# Patient Record
Sex: Male | Born: 1970 | Race: White | Hispanic: No | Smoking: Never smoker
Health system: Southern US, Community
[De-identification: ages and names within clinical notes are randomized; demographics above are authoritative.]

---

## 2015-09-25 ENCOUNTER — Other Ambulatory Visit: Payer: Self-pay | Admitting: Occupational Medicine

## 2015-09-25 ENCOUNTER — Ambulatory Visit: Payer: Self-pay

## 2015-09-25 DIAGNOSIS — M25532 Pain in left wrist: Secondary | ICD-10-CM

## 2016-07-01 ENCOUNTER — Other Ambulatory Visit: Payer: Self-pay | Admitting: Occupational Medicine

## 2016-07-01 ENCOUNTER — Ambulatory Visit: Payer: Self-pay

## 2016-07-01 DIAGNOSIS — M79642 Pain in left hand: Secondary | ICD-10-CM

## 2017-10-26 IMAGING — CR DG HAND COMPLETE 3+V*L*
3 series · 3 of 3 positions shown · non-contrast
Comparison: None.

CLINICAL DATA: Left hand pain at 3rd MCP joint, swelling, no known
trauma

EXAM:
LEFT HAND - COMPLETE 3+ VIEW

[view not recorded (1 of 3)]
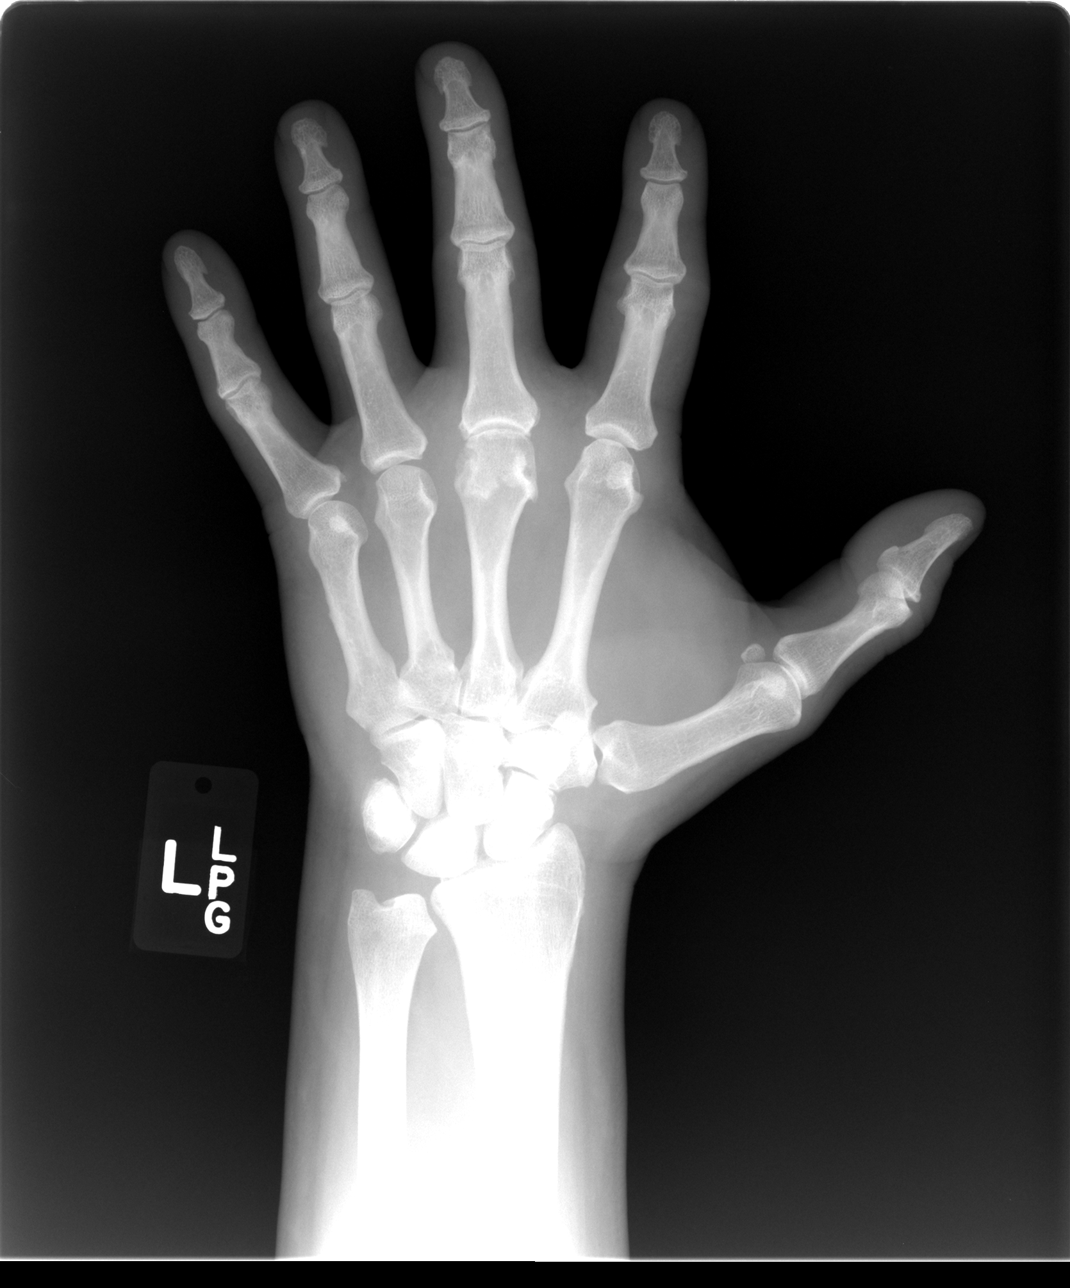

[view not recorded (2 of 3)]
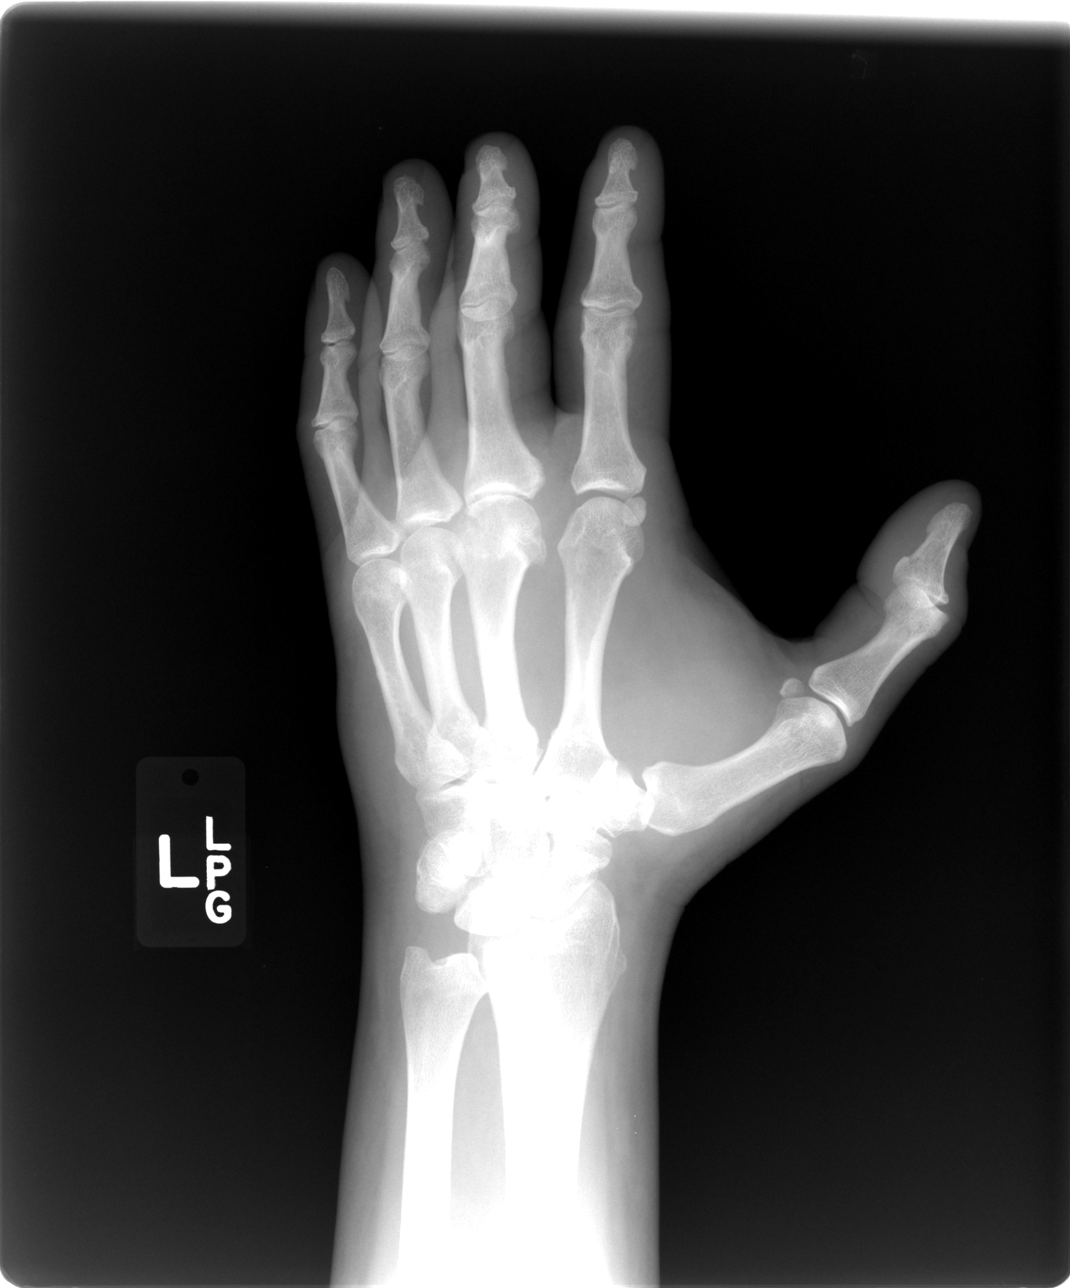

[view not recorded (3 of 3)]
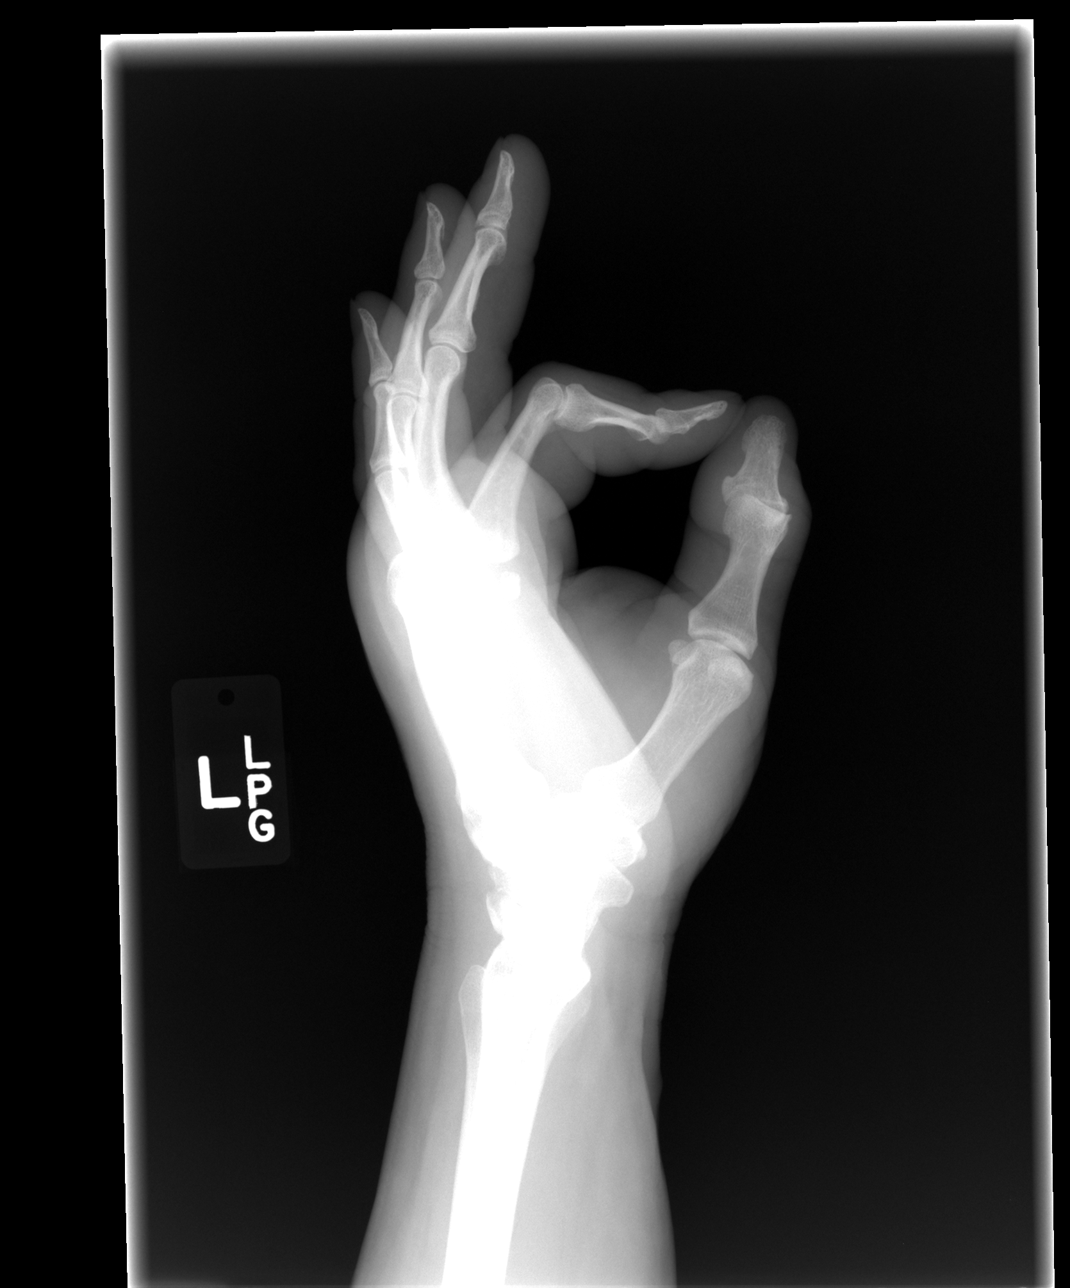

[3 of 3 positions shown; findings below may reference images not displayed]

FINDINGS: Mild to moderate joint space narrowing involving the 3rd MCP joint.
Associated marginal erosions. Hooked osteophytes involving the 2nd
and 3rd MTP joints. Overlying dorsal soft tissue swelling.

No associated chondrocalcinosis.

No fracture or dislocation is seen.

No radiopaque foreign body is seen.
IMPRESSION: Degenerative changes predominantly involving the 3rd MCP joint, as
described above. Primary differential considerations include CPPD
arthropathy or hemochromatosis.

## 2019-10-07 ENCOUNTER — Other Ambulatory Visit: Payer: Self-pay

## 2019-10-07 ENCOUNTER — Ambulatory Visit (HOSPITAL_COMMUNITY): Admission: EM | Admit: 2019-10-07 | Discharge: 2019-10-07 | Discharge status: Against Medical Advice | Payer: Self-pay

## 2019-10-07 NOTE — ED Notes (Signed)
T/C from ED - states pt registering there.

## 2019-10-11 ENCOUNTER — Other Ambulatory Visit: Payer: Self-pay

## 2019-10-16 ENCOUNTER — Ambulatory Visit: Payer: BC Managed Care – PPO | Attending: Internal Medicine

## 2019-10-16 DIAGNOSIS — Z20822 Contact with and (suspected) exposure to covid-19: Secondary | ICD-10-CM

## 2019-10-18 LAB — NOVEL CORONAVIRUS, NAA: SARS-CoV-2, NAA: NOT DETECTED

## 2021-01-21 ENCOUNTER — Ambulatory Visit (INDEPENDENT_AMBULATORY_CARE_PROVIDER_SITE_OTHER): Payer: BC Managed Care – PPO | Admitting: Otolaryngology

## 2021-01-21 ENCOUNTER — Encounter (INDEPENDENT_AMBULATORY_CARE_PROVIDER_SITE_OTHER): Payer: Self-pay | Admitting: Otolaryngology

## 2021-01-21 ENCOUNTER — Other Ambulatory Visit: Payer: Self-pay

## 2021-01-21 VITALS — Temp 97.7°F

## 2021-01-21 DIAGNOSIS — H6123 Impacted cerumen, bilateral: Secondary | ICD-10-CM | POA: Diagnosis not present

## 2021-01-21 NOTE — Progress Notes (Signed)
HPI: Eric Wu is a 50 y.o. male who presents for evaluation of blockage of his right ear.  He has used Q-tips to try to clean the ear but the ear is completely blocked presently.  He has only minimal blockage of the left side..  No past medical history on file.  Social History   Socioeconomic History  . Marital status: Unknown    Spouse name: Not on file  . Number of children: Not on file  . Years of education: Not on file  . Highest education level: Not on file  Occupational History  . Not on file  Tobacco Use  . Smoking status: Never Smoker  . Smokeless tobacco: Never Used  Substance and Sexual Activity  . Alcohol use: Not on file  . Drug use: Not on file  . Sexual activity: Not on file  Other Topics Concern  . Not on file  Social History Narrative  . Not on file   Social Determinants of Health   Financial Resource Strain: Not on file  Food Insecurity: Not on file  Transportation Needs: Not on file  Physical Activity: Not on file  Stress: Not on file  Social Connections: Not on file   No family history on file. No Known Allergies Prior to Admission medications   Not on File     Positive ROS: Otherwise negative  All other systems have been reviewed and were otherwise negative with the exception of those mentioned in the HPI and as above.  Physical Exam: Constitutional: Alert, well-appearing, no acute distress Ears: External ears without lesions or tenderness. Ear canals both ear canals are completely occluded with cerumen with the right side pushed down adjacent to the TM.  This was cleaned on both sides with hydroperoxide and suction.  After cleaning the ear canals both TMs were clear and hearing is much better.. Nasal: External nose without lesions. Clear nasal passages Oral: Oropharynx clear. Neck: No palpable adenopathy or masses Respiratory: Breathing comfortably  Skin: No facial/neck lesions or rash noted.  Cerumen impaction removal  Date/Time:  01/21/2021 2:37 PM Performed by: Drema Halon, MD Authorized by: Drema Halon, MD   Consent:    Consent obtained:  Verbal   Consent given by:  Patient   Risks discussed:  Pain and bleeding Procedure details:    Location:  L ear and R ear   Procedure type: suction   Post-procedure details:    Inspection:  TM intact and canal normal   Hearing quality:  Improved   Patient tolerance of procedure:  Tolerated well, no immediate complications Comments:     Suction and hydroperoxide was used to clean both ear canals.  TMs were clear bilaterally.    Assessment: Bilateral cerumen impactions  Plan: Ears were cleaned in the office with improved hearing. He will follow-up as needed.  Narda Bonds, MD

## 2023-12-09 ENCOUNTER — Encounter: Payer: BC Managed Care – PPO | Admitting: Internal Medicine

## 2023-12-09 NOTE — Progress Notes (Deleted)
   Office Visit Note  Patient: Eric Wu             Date of Birth: 1971/04/05           MRN: [id number]             PCP: Pcp, No Referring: [place], Jesse J, PA-C Visit Date: 12/09/2023 Occupation: @GUAROCC @  Subjective:  No chief complaint on file.   History of Present Illness: Eric Wu is a 53 y.o. male ***     Activities of Daily Living:  Patient reports morning stiffness for *** {minute/hour:19697}.   Patient {ACTIONS;DENIES/REPORTS:21021675::"Denies"} nocturnal pain.  Difficulty dressing/grooming: {ACTIONS;DENIES/REPORTS:21021675::"Denies"} Difficulty climbing stairs: {ACTIONS;DENIES/REPORTS:21021675::"Denies"} Difficulty getting out of chair: {ACTIONS;DENIES/REPORTS:21021675::"Denies"} Difficulty using hands for taps, buttons, cutlery, and/or writing: {ACTIONS;DENIES/REPORTS:21021675::"Denies"}  No Rheumatology ROS completed.   PMFS History:  There are no active problems to display for this patient.   No past medical history on file.  No family history on file. *** The histories are not reviewed yet. Please review them in the "History" navigator section and refresh this SmartLink. Social History   Social History Narrative   Not on file    There is no immunization history on file for this patient.   Objective: Vital Signs: There were no vitals taken for this visit.   Physical Exam   Musculoskeletal Exam: ***  CDAI Exam: CDAI Score: -- Patient Global: --; Provider Global: -- Swollen: --; Tender: -- Joint Exam 12/09/2023   No joint exam has been documented for this visit   There is currently no information documented on the homunculus. Go to the Rheumatology activity and complete the homunculus joint exam.  Investigation: No additional findings.  Imaging: No results found.  Recent Labs: No results found for: "WBC", "HGB", "PLT", "NA", "K", "CL", "CO2", "GLUCOSE", "BUN", "CREATININE", "BILITOT", "ALKPHOS", "AST", "ALT", "PROT",  "ALBUMIN", "CALCIUM", "GFRAA", "QFTBGOLD", "QFTBGOLDPLUS"  Speciality Comments: No specialty comments available.  Procedures:  No procedures performed Allergies: Patient has no known allergies.   Assessment / Plan:     Visit Diagnoses: No diagnosis found.  Orders: No orders of the defined types were placed in this encounter.  No orders of the defined types were placed in this encounter.   Face-to-face time spent with patient was *** minutes. Greater than 50% of time was spent in counseling and coordination of care.  Follow-Up Instructions: No follow-ups on file.   Fuller Plan, MD  Note - This record has been created using [employer].  Chart creation errors have been sought, but may not always  have been located. Such creation errors do not reflect on  the standard of medical care.
